# Patient Record
Sex: Female | Born: 1985 | Race: Black or African American | Hispanic: No | Marital: Single
Health system: Northeastern US, Academic
[De-identification: ages and names within clinical notes are randomized; demographics above are authoritative.]

---

## 2009-03-07 IMAGING — CR RAD CHEST PA LAT
2 series · 2 of 2 positions shown · non-contrast
Comparison: none

[PA]
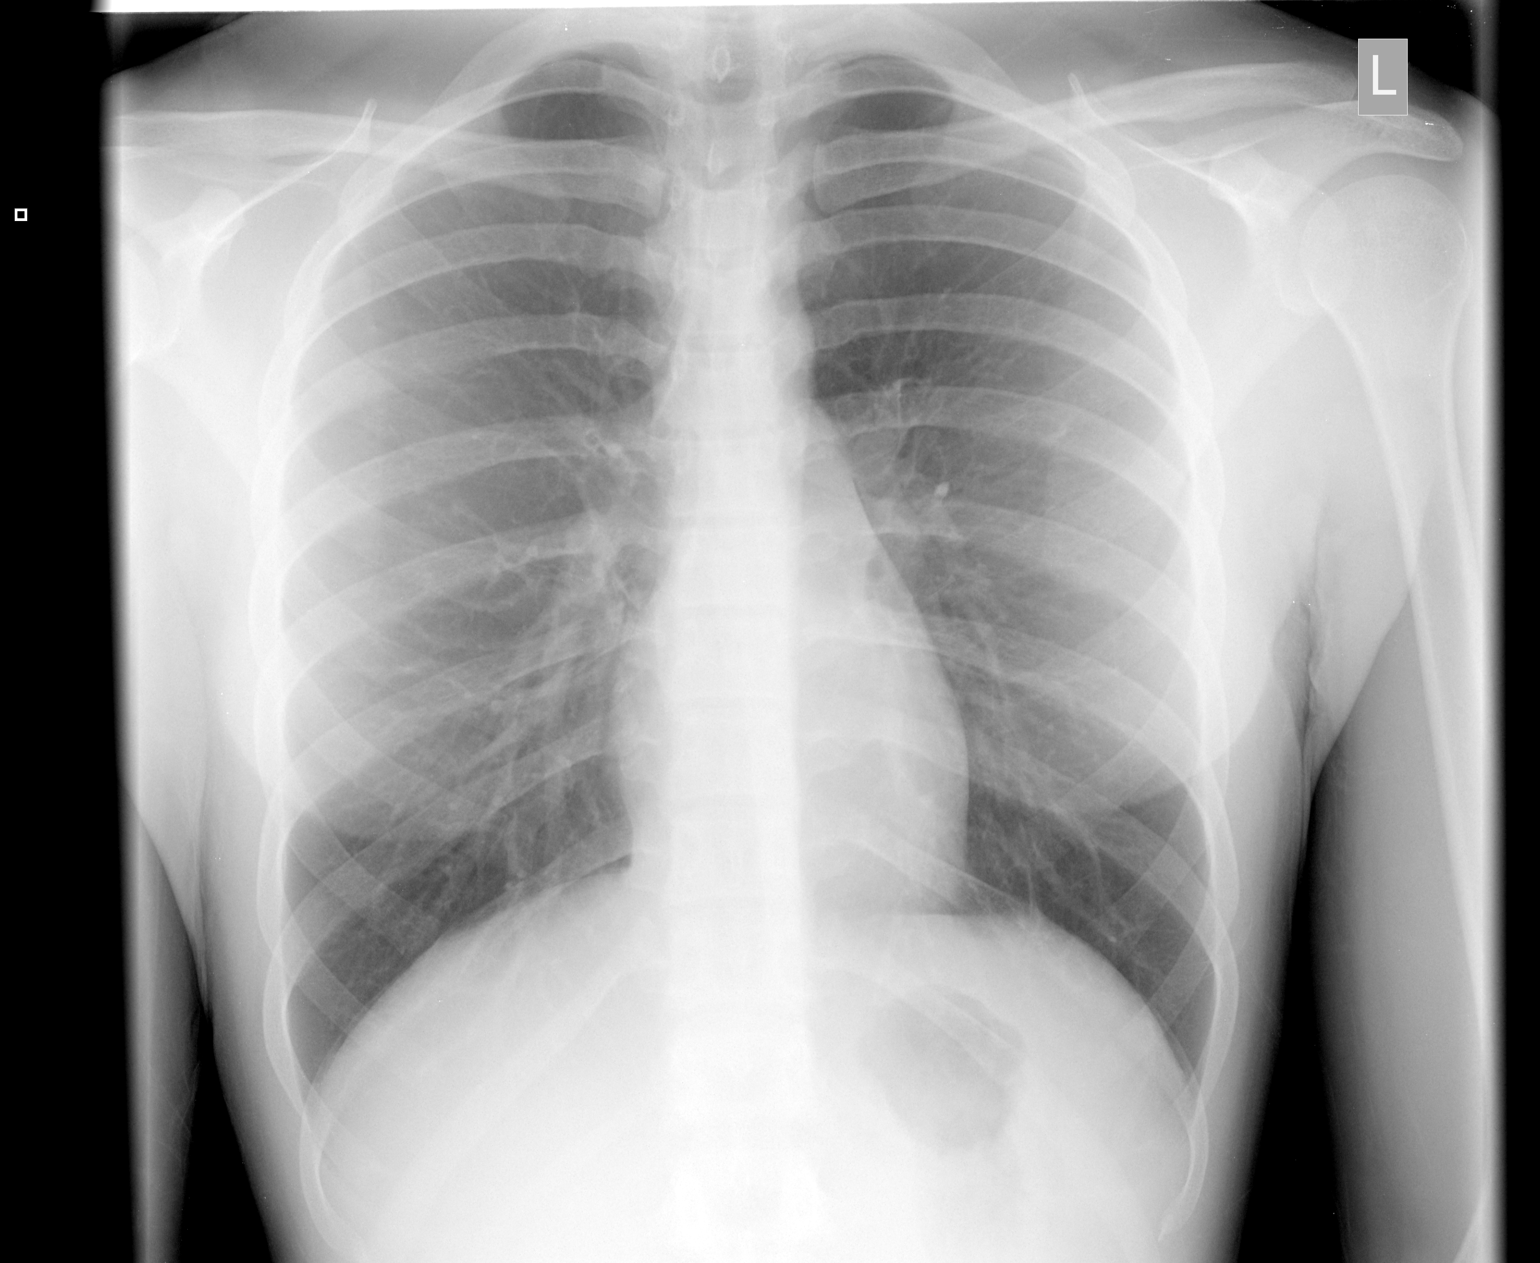

[left lateral]
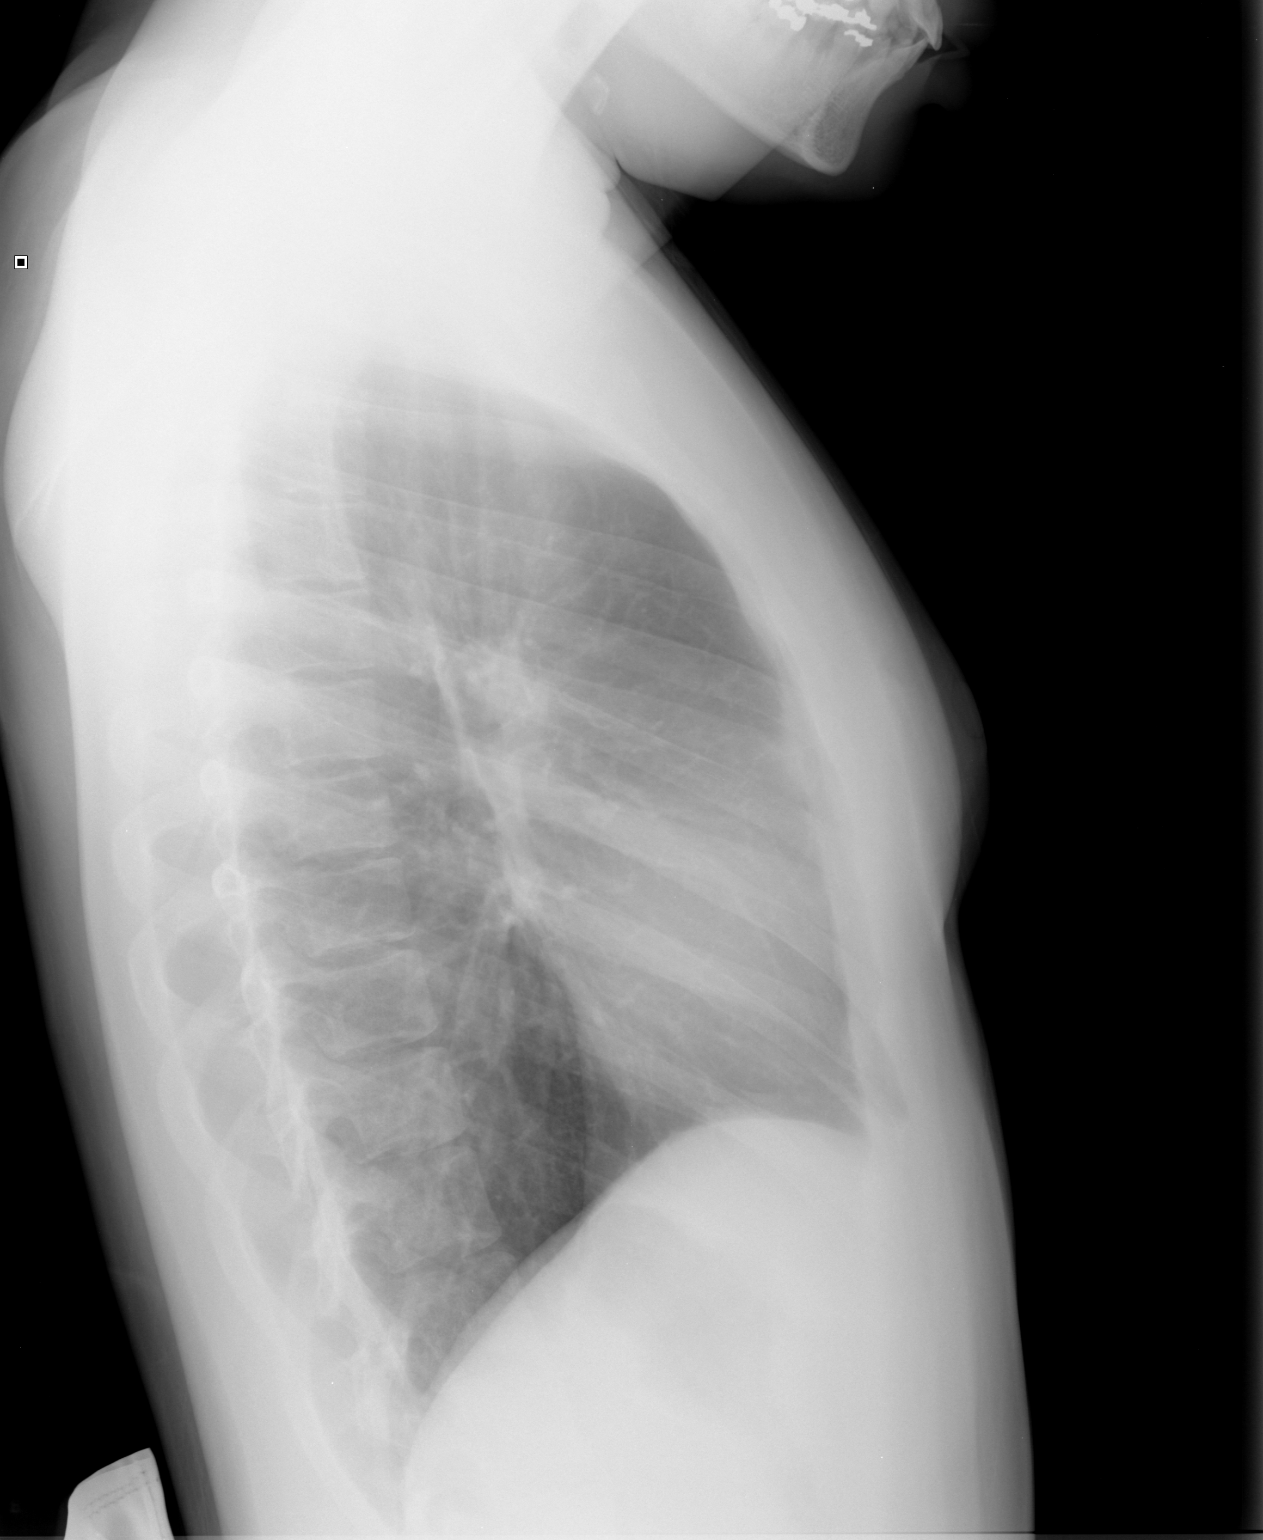

[2 of 2 positions shown; findings below may reference images not displayed]

HISTORY-  Cough. 

FINDINGS-  PA and lateral views of the chest were obtained.  

The lungs are clear.  No evidence of pleural effusion or pneumothorax.

Cardiomediastinal silhouette is within normal limits.  

IMPRESSION- 

Normal examination of the chest. 

/tmc

## 2019-02-04 MED ORDER — SERTRALINE 25 MG TABLET
25 | ORAL_TABLET | Freq: Every day | ORAL | 1 refills | 90.00000 days | Status: AC
Start: 2019-02-04 — End: 2019-05-03

## 2019-05-03 ENCOUNTER — Ambulatory Visit: Admit: 2019-05-03 | Payer: PRIVATE HEALTH INSURANCE | Attending: Obstetrics and Gynecology | Primary: Internal Medicine

## 2019-05-03 ENCOUNTER — Inpatient Hospital Stay: Admit: 2019-05-03 | Discharge: 2019-05-03 | Payer: PRIVATE HEALTH INSURANCE | Primary: Internal Medicine

## 2019-05-03 DIAGNOSIS — Z113 Encounter for screening for infections with a predominantly sexual mode of transmission: Secondary | ICD-10-CM

## 2019-05-03 DIAGNOSIS — Z01419 Encounter for gynecological examination (general) (routine) without abnormal findings: Secondary | ICD-10-CM

## 2019-05-03 MED ORDER — NORGESTIMATE 0.18 MG/0.215MG/0.25 MG-ETHINYL ESTRADIOL 0.025 MG TABLET
0.180.2150.25 | ORAL_TABLET | Freq: Every day | ORAL | 3 refills | 84.00000 days | Status: AC
Start: 2019-05-03 — End: 2019-05-27

## 2019-05-03 MED ORDER — ESCITALOPRAM 10 MG TABLET
10 | ORAL | 2.00 refills | 90.00000 days | Status: AC
Start: 2019-05-03 — End: ?

## 2019-05-03 MED ORDER — DEXTROAMPHETAMINE-AMPHETAMINE 5 MG TABLET
5 | ORAL | 1.00 refills | 30.00000 days | Status: AC
Start: 2019-05-03 — End: 2022-05-27

## 2019-05-03 MED ORDER — ADDERALL XR 10 MG CAPSULE,EXTENDED RELEASE
10 | ORAL | 1.00 refills | 30.00000 days | Status: AC
Start: 2019-05-03 — End: ?

## 2019-05-03 MED ORDER — AZITHROMYCIN 250 MG TABLET
250 | ORAL | 1.00 refills | 5.00000 days | Status: AC | PRN
Start: 2019-05-03 — End: 2019-05-03

## 2019-05-03 MED ORDER — CEPHALEXIN 500 MG CAPSULE
500 | ORAL | 1.00 refills | 7.00000 days | Status: AC | PRN
Start: 2019-05-03 — End: 2019-06-07

## 2019-05-07 ENCOUNTER — Encounter: Admit: 2019-05-07 | Payer: PRIVATE HEALTH INSURANCE | Attending: Obstetrics and Gynecology | Primary: Internal Medicine

## 2019-05-07 DIAGNOSIS — Z01419 Encounter for gynecological examination (general) (routine) without abnormal findings: Secondary | ICD-10-CM

## 2019-05-09 LAB — CHLAMYDIA TRACHOMATIS, NAAT (LAB ORDER ONLY) (BH GH L LMW YH): BKR CHLAMYDIA, DNA PROBE: NEGATIVE

## 2019-05-09 LAB — HPV DNA (HIGH RISK)     (GH Q WH): BKR HPV DNA HIGH RISK: DETECTED — AB

## 2019-05-09 LAB — NEISSERIA GONORRHEA, NAAT (LAB ORDER ONLY)   (BH GH L LMW YH): BKR NEISSERIA GONORRHOEAE, DNA PROBE: NEGATIVE

## 2019-05-13 ENCOUNTER — Inpatient Hospital Stay: Admit: 2019-05-13 | Discharge: 2019-05-13 | Payer: PRIVATE HEALTH INSURANCE | Primary: Internal Medicine

## 2019-05-13 ENCOUNTER — Encounter: Admit: 2019-05-13 | Payer: PRIVATE HEALTH INSURANCE | Attending: Obstetrics and Gynecology | Primary: Internal Medicine

## 2019-05-13 DIAGNOSIS — Z01419 Encounter for gynecological examination (general) (routine) without abnormal findings: Secondary | ICD-10-CM

## 2019-05-17 LAB — HPV GENOTYPE, 16/18     (GH L)
HPV 16: NOT DETECTED
HPV 18: NOT DETECTED

## 2019-05-27 MED ORDER — NORGESTIMATE 0.18 MG/0.215MG/0.25 MG-ETHINYL ESTRADIOL 0.025 MG TABLET
0.180.2150.25 | ORAL_TABLET | Freq: Every day | ORAL | 2 refills | 84.00000 days | Status: AC
Start: 2019-05-27 — End: 2019-06-07

## 2019-06-07 ENCOUNTER — Inpatient Hospital Stay: Admit: 2019-06-07 | Discharge: 2019-06-07 | Payer: PRIVATE HEALTH INSURANCE | Primary: Internal Medicine

## 2019-06-07 ENCOUNTER — Encounter: Admit: 2019-06-07 | Payer: PRIVATE HEALTH INSURANCE | Attending: Obstetrics and Gynecology | Primary: Internal Medicine

## 2019-06-07 DIAGNOSIS — N898 Other specified noninflammatory disorders of vagina: Secondary | ICD-10-CM

## 2019-06-07 MED ORDER — DROSPIRENONE 3 MG-ETHINYL ESTRADIOL 0.02 MG TABLET
3-0.02 | ORAL_TABLET | Freq: Every day | ORAL | 1 refills | 84.00000 days | Status: AC
Start: 2019-06-07 — End: 2019-08-26

## 2019-06-10 LAB — GENITAL CULTURE     (BH GH LMW Q YH): BKR GENITAL CULTURE: NORMAL

## 2019-08-26 MED ORDER — VESTURA (28) 3 MG-0.02 MG TABLET
3-0.02 | ORAL_TABLET | ORAL | 1 refills | 84.00000 days | Status: AC
Start: 2019-08-26 — End: 2019-12-09

## 2019-12-09 MED ORDER — VESTURA (28) 3 MG-0.02 MG TABLET
3-0.02 | ORAL_TABLET | ORAL | 1 refills | 84.00000 days | Status: AC
Start: 2019-12-09 — End: 2020-04-13

## 2020-04-12 MED ORDER — VESTURA (28) 3 MG-0.02 MG TABLET
3-0.02 | ORAL_TABLET | ORAL | 1 refills | 84.00000 days | Status: AC
Start: 2020-04-12 — End: 2020-07-06

## 2020-05-05 ENCOUNTER — Ambulatory Visit: Admit: 2020-05-05 | Payer: PRIVATE HEALTH INSURANCE | Attending: Obstetrics and Gynecology | Primary: Internal Medicine

## 2020-05-05 ENCOUNTER — Inpatient Hospital Stay: Admit: 2020-05-05 | Discharge: 2020-05-05 | Payer: PRIVATE HEALTH INSURANCE | Primary: Internal Medicine

## 2020-05-05 DIAGNOSIS — N898 Other specified noninflammatory disorders of vagina: Secondary | ICD-10-CM

## 2020-05-05 MED ORDER — FLUCONAZOLE 150 MG TABLET
150 | ORAL_TABLET | Freq: Once | ORAL | 1 refills | 3.00000 days | Status: AC
Start: 2020-05-05 — End: ?

## 2020-05-05 MED ORDER — TERCONAZOLE 0.4 % VAGINAL CREAM
0.4 | Freq: Every evening | VAGINAL | 1 refills | 7.00000 days | Status: AC
Start: 2020-05-05 — End: 2020-05-15

## 2020-05-06 MED ORDER — METRONIDAZOLE 500 MG TABLET
500 | ORAL_TABLET | Freq: Once | ORAL | 1 refills | 7.00000 days | Status: AC
Start: 2020-05-06 — End: ?

## 2020-05-15 ENCOUNTER — Ambulatory Visit: Admit: 2020-05-15 | Payer: PRIVATE HEALTH INSURANCE | Attending: Obstetrics and Gynecology | Primary: Internal Medicine

## 2020-05-15 ENCOUNTER — Inpatient Hospital Stay: Admit: 2020-05-15 | Discharge: 2020-05-15 | Payer: PRIVATE HEALTH INSURANCE | Primary: Internal Medicine

## 2020-05-15 DIAGNOSIS — Z01419 Encounter for gynecological examination (general) (routine) without abnormal findings: Secondary | ICD-10-CM

## 2020-05-15 DIAGNOSIS — Z113 Encounter for screening for infections with a predominantly sexual mode of transmission: Secondary | ICD-10-CM

## 2020-05-27 MED ORDER — FLUCONAZOLE 150 MG TABLET
150 | ORAL_TABLET | Freq: Once | ORAL | 1 refills | 3.00000 days | Status: AC
Start: 2020-05-27 — End: ?

## 2020-06-09 MED ORDER — METRONIDAZOLE 500 MG TABLET
500 | ORAL_TABLET | Freq: Two times a day (BID) | ORAL | 1 refills | 7.00000 days | Status: AC
Start: 2020-06-09 — End: ?

## 2020-07-06 MED ORDER — VESTURA (28) 3 MG-0.02 MG TABLET
3-0.02 | ORAL_TABLET | ORAL | 1 refills | 84.00000 days | Status: AC
Start: 2020-07-06 — End: 2022-05-27

## 2021-01-01 ENCOUNTER — Ambulatory Visit: Admit: 2021-01-01 | Payer: PRIVATE HEALTH INSURANCE | Attending: Obstetrics and Gynecology | Primary: Internal Medicine

## 2021-01-01 ENCOUNTER — Inpatient Hospital Stay: Admit: 2021-01-01 | Discharge: 2021-01-01 | Payer: PRIVATE HEALTH INSURANCE | Primary: Internal Medicine

## 2021-01-01 DIAGNOSIS — R109 Unspecified abdominal pain: Secondary | ICD-10-CM

## 2021-01-01 DIAGNOSIS — N939 Abnormal uterine and vaginal bleeding, unspecified: Secondary | ICD-10-CM

## 2021-01-28 ENCOUNTER — Inpatient Hospital Stay: Admit: 2021-01-28 | Discharge: 2021-01-28 | Payer: PRIVATE HEALTH INSURANCE

## 2021-01-28 LAB — INFLUENZA A+B/RSV BY RT-PCR
BKR INFLUENZA A: POSITIVE — AB
BKR INFLUENZA B: NEGATIVE
BKR RESPIRATORY SYNCYTIAL VIRUS: NEGATIVE

## 2021-01-28 LAB — SARS COV-2 (COVID-19) RNA: BKR SARS-COV-2 RNA (COVID-19) (YH): NEGATIVE

## 2021-01-28 NOTE — Discharge Instructions
You are outside the window for Tamiflu.No adjustments were made to your home medication regimen.You will need to follow up with your primary care doctor within a week.  Call your doctor's office as soon as possible to schedule an appointment and to let your doctor know that your condition required a visit to the Emergency Room.Return to ED if any of the following develop:- Chest pain and/or shortness of breath- Fever above 100.4- Vomiting that does not stop- Worsening of your current symptoms- Any other symptoms that you find concerningThank you for trusting your care to [place].  Do not hesitate to return to the Emergency Department if you feel the need.

## 2021-01-28 NOTE — ED Notes
10:45 AM Pt comes to ER w/ her son who is also being evaluated. Pt ambulatory to ER room c/o flu like sx. Pt reports she tested pos for covid on 12/12, began improving on the 20th, but on the 26th began experiencing cough, runny nose, sore throat. Denies CP/SOB. Requesting flu swab. VSS. NAD/NARD. PA Purcell at bedside for eval. [id number]: D/c instructions provided to pt; she verbalized understanding and denied further questions. VSS. Pt waiting for son to be d/c'd then exiting unit w/ family.

## 2021-01-28 NOTE — Telephone Encounter
The patient was informed and educated regarding positive Influenza A result.

## 2021-01-28 NOTE — ED Provider Notes
?   2014 CD-Notes ?  All Rights ReservedEmergency Department	Chief Complaint:Chief Complaint Patient presents with ? Flu Like Symptoms   Ambulatory to ER c/o flu like symptoms. Pt reports she tested pos for covid on 12/12, began improving on the 20th, but on the 26th began experiencing cough, runny nose, sore throat. Requesting flu swab.  [id number] Joan Ferrell, 35 y.o., female, presents with URI symptoms.Patient getting over COVID-19 with 01/11/21 positive test. He son tested positive for the flu yesterday. She has been having flu-like symptoms for at least 4 days.Symptoms began several days ago.Associated symptoms include fever, scratchy throat, headache.Severity of symptoms is described as mild.Onset of symptoms was gradual.Historian: PatientReview of Systems:Constitution: 	Subjective feverENT:		'scratchy' throatEYES:		Negative for dischargeCARDIO:	Negative for chest pain RESP:		Negative SOBMUSC:	Negative for muscle aches, joint painSKIN:		Negative for rashNEURO:	Positive for headachePast Medical History:Past Medical History: Diagnosis Date ? Abnormal cervical Papanicolaou smear 05/2019  ASCUS /HPV+ /16/18: neg ? Gestational diabetes  ? Preeclampsia  Past Surgical History: Procedure Laterality Date ? COLPOSCOPY  06/07/2019  Hx of abnormal pap :ASCUS /HPV+/16/18: neg ? DILATION AND EVACUATION   ? NO PAST SURGERIES   Social History:Social History Tobacco Use ? Smoking status: Never ? Smokeless tobacco: Never Vaping Use ? Vaping Use: Never used Substance Use Topics ? Alcohol use: Yes   Comment: Socially ? Drug use: No Family History:Family History Problem Relation Age of Onset ? Diabetes Mother  ? Hypertension Mother  ? No Known Problems Father  ? Diabetes Maternal Grandmother  ? Diabetes Paternal Grandmother  Medications: Medication List  ASK your doctor about these medications * Adderall XR 10 mg 24 hr capsuleGeneric drug: dextroamphetamine-amphetamine XR * dextroamphetamine-amphetamine 5 mg tabletCommonly known as: ADDERALL escitalopram oxalate 10 mg tabletCommonly known as: LEXAPRO Vestura (28) 3-0.02 mg per tabletGeneric drug: drospirenone-ethinyl estradioLTAKE 1 TABLET BY MOUTH EVERY DAY   * This list has 2 medication(s) that are the same as other medications prescribed for you. Read the directions carefully, and ask your doctor or other care provider to review them with you.    Allergies as of 01/28/2021 ? (No Known Allergies) Physical Exam:  BP 125/75  - Pulse 75  - Temp 97.9 ?F (36.6 ?C) (Oral)  - Resp 18  - Wt 84.8 kg  - SpO2 98%  - BMI 30.18 kg/m? 	General Appear:	Alert and oriented well-appearingEars/Nose/Throat:	clearEyes:			no dischargeNeck:			supple, FROMCardiovascular:	regular rate and rhythm without murmurRespiratory:		clearNeurologic:		alert and orientedMedical Decision Making:Nursing notes were reviewed: yesI reviewed the following historical information: noneOxygen saturation interpretation: 98% on RALaboratory studies: NARadiologic Imaging studies: NAIndependently interpreted by ED provider: NADiscussed patient with another provider: noED Course/Assessment/Plan:Alert and oriented pleasantn and well-appearing 35yo female with headache, subjective fever, chills, body aches, scratchy throat, and cough on and off for over a week. She states her COVID symptoms improved on the 20th then returned several days ago (over 48hr ago). Her 3yo son is being evaluated today for the flu, which he tested positive for yesterday.Does not meet criteria for xofluza/tamiflu. Patient understands. We discussed supportive care and strict return precautions.[id number] URI______________________________________________________________________Condition: 	goodDisposition: homeDiagnosis: 	Viral URI?2014 CD-Notes?  LL[employer] All Rights Reserved; version 2.0; revised April, 2014cdnotes/cduri Marla Roe, APRN12/29/22 1114

## 2022-05-24 ENCOUNTER — Encounter: Admit: 2022-05-24 | Payer: PRIVATE HEALTH INSURANCE | Attending: Obstetrics and Gynecology | Primary: Internal Medicine

## 2022-05-24 ENCOUNTER — Encounter: Admit: 2022-05-24 | Payer: Managed Care (Private) | Attending: Obstetrics and Gynecology | Primary: Internal Medicine

## 2022-05-24 ENCOUNTER — Inpatient Hospital Stay: Admit: 2022-05-24 | Discharge: 2022-05-24 | Payer: PRIVATE HEALTH INSURANCE | Primary: Internal Medicine

## 2022-05-24 ENCOUNTER — Ambulatory Visit: Admit: 2022-05-24 | Payer: PRIVATE HEALTH INSURANCE | Attending: Obstetrics and Gynecology | Primary: Internal Medicine

## 2022-05-24 DIAGNOSIS — R350 Frequency of micturition: Secondary | ICD-10-CM

## 2022-05-24 MED ORDER — NITROFURANTOIN MONOHYDRATE/MACROCRYSTALS 100 MG CAPSULE
100 | ORAL_CAPSULE | Freq: Two times a day (BID) | ORAL | 1 refills | 5.00000 days | Status: AC
Start: 2022-05-24 — End: ?

## 2022-06-01 ENCOUNTER — Encounter: Admit: 2022-06-01 | Payer: Managed Care (Private) | Primary: Internal Medicine

## 2022-06-08 ENCOUNTER — Ambulatory Visit: Admit: 2022-06-08 | Payer: Managed Care (Private) | Attending: Obstetrics and Gynecology | Primary: Internal Medicine

## 2022-06-08 ENCOUNTER — Inpatient Hospital Stay: Admit: 2022-06-08 | Discharge: 2022-06-08 | Payer: PRIVATE HEALTH INSURANCE | Primary: Internal Medicine

## 2022-06-08 ENCOUNTER — Encounter: Admit: 2022-06-08 | Payer: PRIVATE HEALTH INSURANCE | Attending: Obstetrics and Gynecology | Primary: Internal Medicine

## 2022-06-08 ENCOUNTER — Ambulatory Visit: Admit: 2022-06-08 | Payer: PRIVATE HEALTH INSURANCE | Attending: Obstetrics and Gynecology | Primary: Internal Medicine

## 2022-06-08 DIAGNOSIS — Z01419 Encounter for gynecological examination (general) (routine) without abnormal findings: Secondary | ICD-10-CM

## 2022-06-08 MED ORDER — FLUOXETINE 10 MG CAPSULE
10 | Freq: Every day | ORAL | 2.00 refills | 90.00000 days | Status: AC
Start: 2022-06-08 — End: ?

## 2022-06-10 ENCOUNTER — Encounter: Admit: 2022-06-10 | Payer: PRIVATE HEALTH INSURANCE | Attending: Obstetrics and Gynecology | Primary: Internal Medicine

## 2022-06-20 ENCOUNTER — Encounter: Admit: 2022-06-20 | Payer: PRIVATE HEALTH INSURANCE | Attending: Obstetrics and Gynecology | Primary: Internal Medicine

## 2022-06-22 ENCOUNTER — Encounter: Admit: 2022-06-22 | Payer: PRIVATE HEALTH INSURANCE | Attending: Obstetrics and Gynecology | Primary: Internal Medicine

## 2022-06-22 ENCOUNTER — Encounter: Admit: 2022-06-22 | Payer: PRIVATE HEALTH INSURANCE | Primary: Internal Medicine

## 2022-06-23 ENCOUNTER — Encounter: Admit: 2022-06-23 | Payer: PRIVATE HEALTH INSURANCE | Primary: Internal Medicine

## 2022-07-01 ENCOUNTER — Ambulatory Visit: Admit: 2022-07-01 | Payer: PRIVATE HEALTH INSURANCE | Primary: Internal Medicine

## 2022-07-04 ENCOUNTER — Encounter: Admit: 2022-07-04 | Payer: PRIVATE HEALTH INSURANCE | Attending: Obstetrics and Gynecology | Primary: Internal Medicine

## 2023-01-12 ENCOUNTER — Ambulatory Visit: Admit: 2023-01-12 | Payer: PRIVATE HEALTH INSURANCE | Primary: Internal Medicine

## 2023-04-28 ENCOUNTER — Encounter: Admit: 2023-04-28 | Payer: PRIVATE HEALTH INSURANCE | Primary: Internal Medicine

## 2023-05-04 ENCOUNTER — Telehealth: Admit: 2023-05-04 | Payer: PRIVATE HEALTH INSURANCE | Attending: Obstetrics and Gynecology | Primary: Internal Medicine

## 2023-05-04 ENCOUNTER — Encounter: Admit: 2023-05-04 | Payer: PRIVATE HEALTH INSURANCE | Primary: Internal Medicine
